# Patient Record
Sex: Female | Born: 2005 | Race: Black or African American | Hispanic: No | Marital: Single
Health system: Southern US, Community
[De-identification: ages and names within clinical notes are randomized; demographics above are authoritative.]

## PROBLEM LIST (undated history)

## (undated) DIAGNOSIS — E079 Disorder of thyroid, unspecified: Secondary | ICD-10-CM

## (undated) DIAGNOSIS — U071 COVID-19: Secondary | ICD-10-CM

## (undated) DIAGNOSIS — T783XXA Angioneurotic edema, initial encounter: Secondary | ICD-10-CM

## (undated) DIAGNOSIS — L309 Dermatitis, unspecified: Secondary | ICD-10-CM

## (undated) DIAGNOSIS — L509 Urticaria, unspecified: Secondary | ICD-10-CM

## (undated) DIAGNOSIS — J45909 Unspecified asthma, uncomplicated: Secondary | ICD-10-CM

## (undated) DIAGNOSIS — J302 Other seasonal allergic rhinitis: Secondary | ICD-10-CM

## (undated) HISTORY — PX: TONSILLECTOMY: SUR1361

## (undated) HISTORY — DX: Dermatitis, unspecified: L30.9

## (undated) HISTORY — DX: Angioneurotic edema, initial encounter: T78.3XXA

## (undated) HISTORY — DX: Urticaria, unspecified: L50.9

## (undated) HISTORY — DX: Unspecified asthma, uncomplicated: J45.909

---
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
## 2024-01-22 ENCOUNTER — Ambulatory Visit: Admitting: Family Medicine

## 2024-02-14 ENCOUNTER — Ambulatory Visit (HOSPITAL_COMMUNITY)
Admission: EM | Admit: 2024-02-14 | Discharge: 2024-02-14 | Disposition: A | Discharge status: Home / Self Care | Attending: Emergency Medicine | Admitting: Emergency Medicine

## 2024-02-14 ENCOUNTER — Encounter (HOSPITAL_COMMUNITY): Payer: Self-pay

## 2024-02-14 DIAGNOSIS — B349 Viral infection, unspecified: Secondary | ICD-10-CM | POA: Insufficient documentation

## 2024-02-14 LAB — POCT RAPID STREP A (OFFICE): Rapid Strep A Screen: NEGATIVE

## 2024-02-14 MED ORDER — ACETAMINOPHEN 325 MG PO TABS
ORAL_TABLET | ORAL | Status: AC
  Filled 2024-02-14: qty 3

## 2024-02-14 MED ORDER — ACETAMINOPHEN 325 MG PO TABS
975.0000 mg | ORAL_TABLET | Freq: Once | ORAL | Status: AC
  Administered 2024-02-14: 975 mg via ORAL

## 2024-02-14 MED ORDER — IBUPROFEN 800 MG PO TABS: 800.0000 mg | ORAL_TABLET | Freq: Three times a day (TID) | ORAL | 0 refills | Status: AC

## 2024-02-14 MED ORDER — LIDOCAINE VISCOUS HCL 2 % MT SOLN: 15.0000 mL | OROMUCOSAL | 0 refills | Status: DC | PRN

## 2024-02-14 NOTE — Discharge Instructions (Addendum)
 Ibuprofen  can be alternated with tylenol  every 4 hours for sore throat, fever, and body aches. Make sure you are drinking lots of fluids! Salt water gargles, lozenges, or throat spray Try the lidocaine  gargle and spit as often as needed to numb the throat We will call you if anything returns on your throat culture (2-3 days) You are contagious so please try to avoid others and wash your hands frequently
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
## 2024-02-14 NOTE — ED Triage Notes (Signed)
 Pt c/o sore throat, headache, and body aches x3 days. States ibuprofen  800mg  yesterday with relief.

## 2024-02-17 ENCOUNTER — Ambulatory Visit (HOSPITAL_COMMUNITY): Payer: Self-pay

## 2024-02-17 LAB — CULTURE, GROUP A STREP (THRC)

## 2024-05-30 ENCOUNTER — Ambulatory Visit (HOSPITAL_COMMUNITY)

## 2024-05-31 ENCOUNTER — Ambulatory Visit (HOSPITAL_COMMUNITY)

## 2024-06-09 ENCOUNTER — Ambulatory Visit (HOSPITAL_COMMUNITY)
Admission: RE | Admit: 2024-06-09 | Discharge: 2024-06-09 | Disposition: A | Discharge status: Home / Self Care | Source: Ambulatory Visit | Attending: Student | Admitting: Student

## 2024-06-09 ENCOUNTER — Other Ambulatory Visit: Payer: Self-pay

## 2024-06-09 ENCOUNTER — Encounter (HOSPITAL_COMMUNITY): Payer: Self-pay

## 2024-06-09 VITALS — BP 102/69 | HR 61 | Temp 97.9°F | Resp 18

## 2024-06-09 DIAGNOSIS — K5901 Slow transit constipation: Secondary | ICD-10-CM | POA: Diagnosis present

## 2024-06-09 DIAGNOSIS — R399 Unspecified symptoms and signs involving the genitourinary system: Secondary | ICD-10-CM | POA: Insufficient documentation

## 2024-06-09 DIAGNOSIS — Z113 Encounter for screening for infections with a predominantly sexual mode of transmission: Secondary | ICD-10-CM | POA: Insufficient documentation

## 2024-06-09 DIAGNOSIS — J301 Allergic rhinitis due to pollen: Secondary | ICD-10-CM | POA: Insufficient documentation

## 2024-06-09 DIAGNOSIS — Z3202 Encounter for pregnancy test, result negative: Secondary | ICD-10-CM | POA: Insufficient documentation

## 2024-06-09 DIAGNOSIS — K644 Residual hemorrhoidal skin tags: Secondary | ICD-10-CM | POA: Insufficient documentation

## 2024-06-09 DIAGNOSIS — H65193 Other acute nonsuppurative otitis media, bilateral: Secondary | ICD-10-CM | POA: Insufficient documentation

## 2024-06-09 LAB — POCT URINALYSIS DIP (MANUAL ENTRY)
Bilirubin, UA: NEGATIVE
Blood, UA: NEGATIVE
Glucose, UA: NEGATIVE mg/dL
Ketones, POC UA: NEGATIVE mg/dL
Nitrite, UA: NEGATIVE
Protein Ur, POC: NEGATIVE mg/dL
Spec Grav, UA: 1.025 (ref 1.010–1.025)
Urobilinogen, UA: 1 U/dL
pH, UA: 6 (ref 5.0–8.0)

## 2024-06-09 LAB — POCT URINE PREGNANCY: Preg Test, Ur: NEGATIVE

## 2024-06-09 MED ORDER — CETIRIZINE HCL 10 MG PO TABS: 10.0000 mg | ORAL_TABLET | Freq: Every day | ORAL | 0 refills | Status: AC

## 2024-06-09 MED ORDER — POLYETHYLENE GLYCOL 3350 17 GM/SCOOP PO POWD: 17.0000 g | Freq: Once | ORAL | 0 refills | Status: AC

## 2024-06-09 MED ORDER — TRIAMCINOLONE ACETONIDE 55 MCG/ACT NA AERO: 2.0000 | INHALATION_SPRAY | Freq: Every day | NASAL | 1 refills | Status: AC

## 2024-06-09 MED ORDER — HYDROCORTISONE (PERIANAL) 2.5 % EX CREA: 1.0000 | TOPICAL_CREAM | Freq: Two times a day (BID) | CUTANEOUS | 0 refills | Status: AC

## 2024-06-09 NOTE — Discharge Instructions (Addendum)
-   For your hemorrhoid, start the Anusol  cream, and I recommend taking a bath at least once a day. -For your constipation and hemorrhoid, start the MiraLAX , 1-2 capfuls daily. -For your allergies and hearing changes, start the Zyrtec  and nasal steroid spray

## 2024-06-09 NOTE — ED Triage Notes (Signed)
 PT reports dysuria and cloudy urine for one week. Pt also reports hemorrhoids problems. Pt has seen blood in water after going  2 times. Pt has had a ear infection for 11/2 weeks.
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
## 2024-06-10 ENCOUNTER — Ambulatory Visit (HOSPITAL_COMMUNITY): Payer: Self-pay

## 2024-06-10 LAB — CERVICOVAGINAL ANCILLARY ONLY
Bacterial Vaginitis (gardnerella): POSITIVE — AB
Chlamydia: POSITIVE — AB
Comment: NEGATIVE
Comment: NEGATIVE
Comment: NEGATIVE
Comment: NORMAL
Neisseria Gonorrhea: NEGATIVE
Trichomonas: NEGATIVE

## 2024-06-10 LAB — URINE CULTURE

## 2024-06-10 MED ORDER — DOXYCYCLINE HYCLATE 100 MG PO TABS: 100.0000 mg | ORAL_TABLET | Freq: Two times a day (BID) | ORAL | 0 refills | Status: AC

## 2024-06-10 MED ORDER — METRONIDAZOLE 500 MG PO TABS
500.0000 mg | ORAL_TABLET | Freq: Two times a day (BID) | ORAL | 0 refills | Status: AC
Start: 2024-06-10 — End: 2024-06-17

## 2024-06-17 MED ORDER — DOXYCYCLINE HYCLATE 100 MG PO TABS: 100.0000 mg | ORAL_TABLET | Freq: Two times a day (BID) | ORAL | 0 refills | Status: AC

## 2024-06-17 MED ORDER — METRONIDAZOLE 500 MG PO TABS: 500.0000 mg | ORAL_TABLET | Freq: Two times a day (BID) | ORAL | 0 refills | Status: AC

## 2024-06-17 NOTE — Telephone Encounter (Signed)
 Pt called stating she vomited 2 doses of both Doxy and Flagyl . Declines offer for metrogel .

## 2024-07-13 ENCOUNTER — Encounter (HOSPITAL_COMMUNITY): Payer: Self-pay

## 2024-07-13 ENCOUNTER — Ambulatory Visit (HOSPITAL_COMMUNITY)
Admission: RE | Admit: 2024-07-13 | Discharge: 2024-07-13 | Disposition: A | Discharge status: Home / Self Care | Source: Ambulatory Visit | Attending: Physician Assistant | Admitting: Physician Assistant

## 2024-07-13 VITALS — BP 131/73 | HR 62 | Temp 98.9°F | Resp 16

## 2024-07-13 DIAGNOSIS — Z113 Encounter for screening for infections with a predominantly sexual mode of transmission: Secondary | ICD-10-CM | POA: Insufficient documentation

## 2024-07-13 LAB — POCT URINE PREGNANCY: Preg Test, Ur: NEGATIVE

## 2024-07-13 LAB — POCT URINALYSIS DIP (MANUAL ENTRY)
Bilirubin, UA: NEGATIVE
Blood, UA: NEGATIVE
Glucose, UA: NEGATIVE mg/dL
Ketones, POC UA: NEGATIVE mg/dL
Leukocytes, UA: NEGATIVE
Nitrite, UA: NEGATIVE
Protein Ur, POC: NEGATIVE mg/dL
Spec Grav, UA: 1.02 (ref 1.010–1.025)
Urobilinogen, UA: 0.2 U/dL
pH, UA: 7 (ref 5.0–8.0)

## 2024-07-13 LAB — HIV ANTIBODY (ROUTINE TESTING W REFLEX): HIV Screen 4th Generation wRfx: NONREACTIVE

## 2024-07-13 NOTE — ED Triage Notes (Addendum)
 Pt states she would like repeat STI testing she did complete all the meds. Last testing was 4 weeks ago. She denies any sx.   She would like cyto and blood work.
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
## 2024-07-13 NOTE — Discharge Instructions (Signed)
 You were seen today for STD screening.  We collected a cervicovaginal swab that we will assess for gonorrhea, chlamydia, trichomonas, bacterial vaginosis, yeast.  If collected blood work that we will assess for HIV and syphilis.  We will keep you updated with these results once they are available.  If any medications are indicated by those test results we will call you and medications will either be sent to the pharmacy on file or you can return to the urgent care for an injection.  It is recommended that you refrain from sexual activity until your test results are negative or until you have completed an appropriate medication regimen as dictated by your test results.  Please use a condom or another barrier method to help prevent STD transmission.  Please make sure that you communicate with your partners regarding your test results should any positive results, about as they will also need to be tested and screened.

## 2024-07-14 ENCOUNTER — Ambulatory Visit (HOSPITAL_COMMUNITY): Payer: Self-pay

## 2024-07-14 LAB — CERVICOVAGINAL ANCILLARY ONLY
Bacterial Vaginitis (gardnerella): POSITIVE — AB
Candida Glabrata: NEGATIVE
Candida Vaginitis: NEGATIVE
Chlamydia: NEGATIVE
Comment: NEGATIVE
Comment: NEGATIVE
Comment: NEGATIVE
Comment: NEGATIVE
Comment: NEGATIVE
Comment: NORMAL
Neisseria Gonorrhea: NEGATIVE
Trichomonas: NEGATIVE

## 2024-07-14 LAB — RPR: RPR Ser Ql: NONREACTIVE

## 2024-07-15 MED ORDER — METRONIDAZOLE 500 MG PO TABS: 500.0000 mg | ORAL_TABLET | Freq: Two times a day (BID) | ORAL | 0 refills | Status: AC

## 2024-09-08 ENCOUNTER — Telehealth: Admitting: Physician Assistant

## 2024-09-08 DIAGNOSIS — N760 Acute vaginitis: Secondary | ICD-10-CM

## 2024-09-08 DIAGNOSIS — B9689 Other specified bacterial agents as the cause of diseases classified elsewhere: Secondary | ICD-10-CM

## 2024-09-08 MED ORDER — METRONIDAZOLE 0.75 % VA GEL: 1.0000 | Freq: Every day | VAGINAL | 0 refills | Status: AC

## 2024-09-08 NOTE — Patient Instructions (Signed)
 " Barbara [name], thank you for joining Delon CHRISTELLA Dickinson, PA-C for today's virtual visit.  While this provider is not your primary care provider (PCP), if your PCP is located in our provider database this encounter information will be shared with them immediately following your visit.   A [facility] MyChart account gives you access to today's visit and all your visits, tests, and labs performed at [facility]  click here if you don't have a [facility] MyChart account or go to [email or web address]  Consent: (Patient) Barbara [name] provided verbal consent for this virtual visit at the beginning of the encounter.  Current Medications:  Current Outpatient Medications:    metroNIDAZOLE  (METROGEL ) 0.75 % vaginal gel, Place 1 Applicatorful vaginally at bedtime for 7 days., Disp: 70 g, Rfl: 0   albuterol  (VENTOLIN  HFA) 108 (90 Base) MCG/ACT inhaler, Inhale 2 puffs into the lungs every 4 (four) hours as needed for wheezing or shortness of breath., Disp: 1 each, Rfl: 0   cetirizine  (ZYRTEC ) 10 MG tablet, Take 1 tablet (10 mg total) by mouth daily., Disp: 90 tablet, Rfl: 0   EPINEPHrine  0.3 mg/0.3 mL IJ SOAJ injection, Inject 0.3 mg into the muscle as needed for anaphylaxis., Disp: 1 each, Rfl: 2   hydrocortisone  (ANUSOL -HC) 2.5 % rectal cream, Place 1 Application rectally 2 (two) times daily., Disp: 30 g, Rfl: 0   ibuprofen  (ADVIL ) 800 MG tablet, Take 1 tablet (800 mg total) by mouth 3 (three) times daily., Disp: 21 tablet, Rfl: 0   triamcinolone  (NASACORT ) 55 MCG/ACT AERO nasal inhaler, Place 2 sprays into the nose daily., Disp: 1 each, Rfl: 1   Medications ordered in this encounter:  Meds ordered this encounter  Medications   metroNIDAZOLE  (METROGEL ) 0.75 % vaginal gel    Sig: Place 1 Applicatorful vaginally at bedtime for 7 days.    Dispense:  70 g    Refill:  0    Supervising Provider:   BLAISE ALEENE KIDD [[id number]]     *If you need refills on other medications prior to  your next appointment, please contact your pharmacy*  Follow-Up: Call back or seek an in-person evaluation if the symptoms worsen or if the condition fails to improve as anticipated.  [facility] [phone]  Other Instructions Vaginal Probiotics: AZO vaginal probiotic OLLY Happy Hoo-Ha RAW Vaginal Care RenewLife Women's vaginal probiotic RepHresh Pro-B  Vaginal washes: Honey Pot Summer's Eve Vagisil Feminine cleanser  Boric Acid Suppositories  Healthy vaginal hygiene practices    -  Avoid sleeper pajamas. Nightgowns allow air to circulate.  Sleep without underpants whenever possible.   -  Wear cotton underpants during the day. Double-rinse underwear after washing to avoid residual irritants. Do not use fabric softeners for underwear and swimsuits.   - Avoid tights, leotards, leggings, skinny jeans, and other tight-fitting clothing. Skirts and loose-fitting pants allow air to circulate.   - Avoid pantyliners.  Instead use tampons or cotton pads.   - Use the restroom after intercourse to help prevent UTI's   - Daily warm bathing is helpful:     - Soak in clean water (no soap) for 10 to 15 minutes. Adding vinegar or baking soda to the water has not been specifically studied and may not be better than clean water alone.      - Use soap to wash regions other than the genital area just before getting out of the tub. Limit use of any soap on genital areas. Use fragance-free soaps.     -  Rinse the genital area well and gently pat dry.  Don't rub.  Hair dryer to assist with drying can be used only if on cool setting.     - Do not use bubble baths or perfumed soaps.   - Do not use any feminine sprays, douches or powders.  These contain chemicals that will irritate the skin.   - If the genital area is tender or swollen, cool compresses may relieve the discomfort. Unscented wet wipes can be used instead of toilet paper for wiping.    - Emollients, such as Vaseline,  may help protect skin and can be applied to the irritated area.   - Always remember to wipe front-to-back after bowel movements. Pat dry after urination.   - Do not sit in wet swimsuits for long periods of time after swimming   If you have been instructed to have an in-person evaluation today at a local Urgent Care facility, please use the link below. It will take you to a list of all of our available [facility], including address, phone number and hours of operation. Please do not delay care.  [facility]  If you or a family member do not have a primary care provider, use the link below to schedule a visit and establish care. When you choose a [facility] primary care physician or advanced practice provider, you gain a long-term partner in health. Find a Primary Care Provider  Learn more about [facility]'s in-office and virtual care options: [facility] - Get Care Now "
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
## 2024-09-16 ENCOUNTER — Ambulatory Visit
Admission: RE | Admit: 2024-09-16 | Discharge: 2024-09-16 | Disposition: A | Discharge status: Home / Self Care | Attending: Physician Assistant | Admitting: Physician Assistant

## 2024-09-16 ENCOUNTER — Other Ambulatory Visit: Payer: Self-pay

## 2024-09-16 VITALS — BP 135/85 | HR 90 | Temp 98.6°F | Resp 16 | Ht 71.0 in

## 2024-09-16 DIAGNOSIS — Z3201 Encounter for pregnancy test, result positive: Secondary | ICD-10-CM

## 2024-09-16 LAB — POCT URINE PREGNANCY: Preg Test, Ur: POSITIVE — AB

## 2024-09-16 NOTE — ED Triage Notes (Signed)
 Pt presents here today with concerns of possible pregnancy. Took 4 at-home pregnancy tests within the last two days and all came back positive. Pt would like some advice on next steps. Currently rates overall pain a 4/10. Describes as intermittent cramping. No bleeding noted by patient. LMP reported to be 08/25/24.
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
## 2024-09-16 NOTE — Discharge Instructions (Signed)
 You are seen today for pregnancy test.  Your pregnancy test was positive.  It is difficult to say how far along you are in your pregnancy based on your most recent period  So I do recommend that you follow-up with OB/GYN for further evaluation.  Please make sure that you start taking a prenatal vitamin if you plan to continue your pregnancy as this will help prevent neural tube defects and promote appropriate development of the fetus.  In [place]  you have until 12 weeks of gestation to have a legal abortion which can sometimes be extended to 20 weeks if there are fetal abnormalities.  You would need to speak with an OB/GYN to discuss options for pregnancy termination.  If at any point you start to have significant abdominal pain, vaginal bleeding, fever, chest pain, swelling of your extremities, severe headaches, high blood pressure, vision changes, confusion or seizures please go to the emergency room as these could be signs of a medical emergency.
